# Patient Record
Sex: Male | Born: 1935 | Race: White | Hispanic: No
Health system: Southern US, Community
[De-identification: ages and names within clinical notes are randomized; demographics above are authoritative.]

---

## 2015-04-22 ENCOUNTER — Emergency Department (HOSPITAL_COMMUNITY): Payer: Medicare Other

## 2015-04-22 ENCOUNTER — Inpatient Hospital Stay (HOSPITAL_COMMUNITY)
Admission: EM | Admit: 2015-04-22 | Discharge: 2015-05-11 | DRG: 377 | Disposition: E | Payer: Medicare Other | Attending: Internal Medicine | Admitting: Internal Medicine

## 2015-04-22 ENCOUNTER — Encounter (HOSPITAL_COMMUNITY): Payer: Self-pay | Admitting: Emergency Medicine

## 2015-04-22 DIAGNOSIS — D62 Acute posthemorrhagic anemia: Secondary | ICD-10-CM | POA: Diagnosis present

## 2015-04-22 DIAGNOSIS — S065X0A Traumatic subdural hemorrhage without loss of consciousness, initial encounter: Secondary | ICD-10-CM | POA: Diagnosis present

## 2015-04-22 DIAGNOSIS — N184 Chronic kidney disease, stage 4 (severe): Secondary | ICD-10-CM | POA: Diagnosis present

## 2015-04-22 DIAGNOSIS — N179 Acute kidney failure, unspecified: Secondary | ICD-10-CM | POA: Diagnosis present

## 2015-04-22 DIAGNOSIS — K922 Gastrointestinal hemorrhage, unspecified: Secondary | ICD-10-CM | POA: Diagnosis present

## 2015-04-22 DIAGNOSIS — K921 Melena: Secondary | ICD-10-CM | POA: Diagnosis present

## 2015-04-22 DIAGNOSIS — Z515 Encounter for palliative care: Secondary | ICD-10-CM | POA: Diagnosis present

## 2015-04-22 DIAGNOSIS — Z66 Do not resuscitate: Secondary | ICD-10-CM | POA: Diagnosis present

## 2015-04-22 DIAGNOSIS — Z7982 Long term (current) use of aspirin: Secondary | ICD-10-CM | POA: Diagnosis not present

## 2015-04-22 DIAGNOSIS — N189 Chronic kidney disease, unspecified: Secondary | ICD-10-CM

## 2015-04-22 DIAGNOSIS — Z9181 History of falling: Secondary | ICD-10-CM

## 2015-04-22 DIAGNOSIS — W1830XA Fall on same level, unspecified, initial encounter: Secondary | ICD-10-CM | POA: Diagnosis present

## 2015-04-22 DIAGNOSIS — L89311 Pressure ulcer of right buttock, stage 1: Secondary | ICD-10-CM | POA: Diagnosis present

## 2015-04-22 DIAGNOSIS — Y92129 Unspecified place in nursing home as the place of occurrence of the external cause: Secondary | ICD-10-CM | POA: Diagnosis not present

## 2015-04-22 DIAGNOSIS — D649 Anemia, unspecified: Secondary | ICD-10-CM | POA: Diagnosis present

## 2015-04-22 DIAGNOSIS — R52 Pain, unspecified: Secondary | ICD-10-CM

## 2015-04-22 DIAGNOSIS — I129 Hypertensive chronic kidney disease with stage 1 through stage 4 chronic kidney disease, or unspecified chronic kidney disease: Secondary | ICD-10-CM | POA: Diagnosis present

## 2015-04-22 DIAGNOSIS — R4182 Altered mental status, unspecified: Secondary | ICD-10-CM | POA: Diagnosis present

## 2015-04-22 DIAGNOSIS — N390 Urinary tract infection, site not specified: Secondary | ICD-10-CM | POA: Diagnosis present

## 2015-04-22 DIAGNOSIS — E039 Hypothyroidism, unspecified: Secondary | ICD-10-CM | POA: Diagnosis present

## 2015-04-22 DIAGNOSIS — I10 Essential (primary) hypertension: Secondary | ICD-10-CM | POA: Diagnosis present

## 2015-04-22 DIAGNOSIS — S065X9A Traumatic subdural hemorrhage with loss of consciousness of unspecified duration, initial encounter: Secondary | ICD-10-CM

## 2015-04-22 DIAGNOSIS — L8990 Pressure ulcer of unspecified site, unspecified stage: Secondary | ICD-10-CM | POA: Diagnosis present

## 2015-04-22 DIAGNOSIS — S065XAA Traumatic subdural hemorrhage with loss of consciousness status unknown, initial encounter: Secondary | ICD-10-CM

## 2015-04-22 LAB — IRON AND TIBC
Iron: 25 ug/dL — ABNORMAL LOW (ref 45–182)
SATURATION RATIOS: 9 % — AB (ref 17.9–39.5)
TIBC: 280 ug/dL (ref 250–450)
UIBC: 255 ug/dL

## 2015-04-22 LAB — COMPREHENSIVE METABOLIC PANEL
ALBUMIN: 2.1 g/dL — AB (ref 3.5–5.0)
ALK PHOS: 156 U/L — AB (ref 38–126)
ALT: 10 U/L — AB (ref 17–63)
AST: 46 U/L — AB (ref 15–41)
Anion gap: 14 (ref 5–15)
BILIRUBIN TOTAL: 0.7 mg/dL (ref 0.3–1.2)
BUN: 47 mg/dL — AB (ref 6–20)
CO2: 15 mmol/L — ABNORMAL LOW (ref 22–32)
CREATININE: 2.82 mg/dL — AB (ref 0.61–1.24)
Calcium: 8.4 mg/dL — ABNORMAL LOW (ref 8.9–10.3)
Chloride: 111 mmol/L (ref 101–111)
GFR calc Af Amer: 21 mL/min — ABNORMAL LOW (ref >60)
GFR, EST NON AFRICAN AMERICAN: 18 mL/min — AB (ref >60)
GLUCOSE: 81 mg/dL (ref 65–99)
Potassium: 4.7 mmol/L (ref 3.5–5.1)
Sodium: 140 mmol/L (ref 135–145)
TOTAL PROTEIN: 6.2 g/dL — AB (ref 6.5–8.1)

## 2015-04-22 LAB — CBC WITH DIFFERENTIAL/PLATELET
BASOS ABS: 0 10*3/uL (ref 0.0–0.1)
Basophils Relative: 0 %
Eosinophils Absolute: 0 10*3/uL (ref 0.0–0.7)
Eosinophils Relative: 0 %
HEMATOCRIT: 16.9 % — AB (ref 39.0–52.0)
HEMOGLOBIN: 5.2 g/dL — AB (ref 13.0–17.0)
LYMPHS PCT: 4 %
Lymphs Abs: 0.6 10*3/uL — ABNORMAL LOW (ref 0.7–4.0)
MCH: 27.4 pg (ref 26.0–34.0)
MCHC: 30.8 g/dL (ref 30.0–36.0)
MCV: 88.9 fL (ref 78.0–100.0)
MONO ABS: 0.6 10*3/uL (ref 0.1–1.0)
Monocytes Relative: 5 %
NEUTROS ABS: 12.2 10*3/uL — AB (ref 1.7–7.7)
NEUTROS PCT: 91 %
Platelets: 181 10*3/uL (ref 150–400)
RBC: 1.9 MIL/uL — AB (ref 4.22–5.81)
RDW: 19 % — ABNORMAL HIGH (ref 11.5–15.5)
WBC: 13.4 10*3/uL — AB (ref 4.0–10.5)

## 2015-04-22 LAB — FOLATE: Folate: 9.3 ng/mL (ref >5.9)

## 2015-04-22 LAB — RETICULOCYTES
RBC.: 1.77 MIL/uL — AB (ref 4.22–5.81)
RETIC CT PCT: 5.6 % — AB (ref 0.4–3.1)
Retic Count, Absolute: 99.1 10*3/uL (ref 19.0–186.0)

## 2015-04-22 LAB — DIC (DISSEMINATED INTRAVASCULAR COAGULATION)PANEL
Platelets: 242 10*3/uL (ref 150–400)
Smear Review: NONE SEEN

## 2015-04-22 LAB — DIC (DISSEMINATED INTRAVASCULAR COAGULATION) PANEL
APTT: 35 s (ref 24–37)
D DIMER QUANT: 1.39 ug{FEU}/mL — AB (ref 0.00–0.50)
FIBRINOGEN: 314 mg/dL (ref 204–475)
INR: 1.79 — AB (ref 0.00–1.49)
PROTHROMBIN TIME: 20.8 s — AB (ref 11.6–15.2)

## 2015-04-22 LAB — PREPARE RBC (CROSSMATCH)

## 2015-04-22 LAB — URINALYSIS, ROUTINE W REFLEX MICROSCOPIC
BILIRUBIN URINE: NEGATIVE
Glucose, UA: NEGATIVE mg/dL
Hgb urine dipstick: NEGATIVE
KETONES UR: NEGATIVE mg/dL
NITRITE: NEGATIVE
PROTEIN: NEGATIVE mg/dL
Specific Gravity, Urine: 1.015 (ref 1.005–1.030)
pH: 5.5 (ref 5.0–8.0)

## 2015-04-22 LAB — POC OCCULT BLOOD, ED: Fecal Occult Bld: POSITIVE — AB

## 2015-04-22 LAB — URINE MICROSCOPIC-ADD ON
RBC / HPF: NONE SEEN RBC/hpf (ref 0–5)
SQUAMOUS EPITHELIAL / LPF: NONE SEEN

## 2015-04-22 LAB — TROPONIN I: TROPONIN I: 0.08 ng/mL — AB (ref <0.031)

## 2015-04-22 LAB — PROTIME-INR
INR: 1.66 — AB (ref 0.00–1.49)
Prothrombin Time: 19.6 seconds — ABNORMAL HIGH (ref 11.6–15.2)

## 2015-04-22 LAB — LACTIC ACID, PLASMA: Lactic Acid, Venous: 5.9 mmol/L (ref 0.5–2.0)

## 2015-04-22 LAB — VITAMIN B12: VITAMIN B 12: 1149 pg/mL — AB (ref 180–914)

## 2015-04-22 LAB — FERRITIN: Ferritin: 24 ng/mL (ref 24–336)

## 2015-04-22 LAB — ABO/RH: ABO/RH(D): A NEG

## 2015-04-22 MED ORDER — ONDANSETRON HCL 4 MG PO TABS: 4.0000 mg | ORAL_TABLET | Freq: Four times a day (QID) | ORAL | Status: DC | PRN

## 2015-04-22 MED ORDER — CEFTRIAXONE SODIUM 1 G IJ SOLR
1.0000 g | INTRAMUSCULAR | Status: DC
  Filled 2015-04-22: qty 10

## 2015-04-22 MED ORDER — SODIUM CHLORIDE 0.9 % IV BOLUS (SEPSIS)
1000.0000 mL | Freq: Once | INTRAVENOUS | Status: AC
  Administered 2015-04-22: 1000 mL via INTRAVENOUS

## 2015-04-22 MED ORDER — SODIUM CHLORIDE 0.9 % IV SOLN
INTRAVENOUS | Status: DC
  Administered 2015-04-22: 23:00:00 via INTRAVENOUS

## 2015-04-22 MED ORDER — ONDANSETRON HCL 4 MG/2ML IJ SOLN
4.0000 mg | Freq: Once | INTRAMUSCULAR | Status: AC
  Administered 2015-04-22: 4 mg via INTRAVENOUS
  Filled 2015-04-22: qty 2

## 2015-04-22 MED ORDER — DEXTROSE 5 % IV SOLN
1.0000 g | Freq: Once | INTRAVENOUS | Status: AC
  Administered 2015-04-22: 1 g via INTRAVENOUS
  Filled 2015-04-22: qty 10

## 2015-04-22 MED ORDER — SODIUM CHLORIDE 0.9 % IV SOLN
Freq: Once | INTRAVENOUS | Status: AC
  Administered 2015-04-22: 22:00:00 via INTRAVENOUS

## 2015-04-22 MED ORDER — VITAMIN K1 10 MG/ML IJ SOLN
5.0000 mg | Freq: Once | INTRAVENOUS | Status: AC
  Administered 2015-04-22: 5 mg via INTRAVENOUS
  Filled 2015-04-22: qty 0.5

## 2015-04-22 MED ORDER — ONDANSETRON HCL 4 MG/2ML IJ SOLN: 4.0000 mg | Freq: Four times a day (QID) | INTRAMUSCULAR | Status: DC | PRN

## 2015-04-22 MED ORDER — PANTOPRAZOLE SODIUM 40 MG IV SOLR
40.0000 mg | Freq: Two times a day (BID) | INTRAVENOUS | Status: DC
  Administered 2015-04-22: 40 mg via INTRAVENOUS
  Filled 2015-04-22: qty 40

## 2015-04-22 MED ORDER — FENTANYL CITRATE (PF) 100 MCG/2ML IJ SOLN
50.0000 ug | Freq: Once | INTRAMUSCULAR | Status: AC
Start: 2015-04-22 — End: 2015-04-22
  Administered 2015-04-22: 50 ug via INTRAVENOUS
  Filled 2015-04-22: qty 2

## 2015-04-22 NOTE — ED Notes (Signed)
MD made aware of patient family request for an update.  Family made aware of wait time, and will return in approx 20 minutes

## 2015-04-22 NOTE — ED Notes (Signed)
Pt was 91% on RA.  Pt placed on 2L [place]

## 2015-04-22 NOTE — ED Notes (Signed)
Critical hemoglobin of 5.5 called from lab.  Communicated to Dr. Clayborne DanaMesner.

## 2015-04-22 NOTE — ED Provider Notes (Addendum)
CSN: [id number]     Arrival date & time 05/05/2015  1603 History   First MD Initiated Contact with Patient 2015-05-05 1604     Chief Complaint  Patient presents with  . Altered Mental Status     (Consider location/radiation/quality/duration/timing/severity/associated sxs/prior Treatment) Patient is a 82 y.o. male presenting with altered mental status.  Altered Mental Status Presenting symptoms: behavior changes   Severity:  Mild Most recent episode:  More than 2 days ago Episode history:  Single Timing:  Constant Progression:  Worsening Chronicity:  New Context: nursing home resident   Context: not alcohol use, not dementia and not head injury   Associated symptoms: weakness   Associated symptoms: no abdominal pain, no fever, no nausea and no seizures     No past medical history on file. No past surgical history on file. No family history on file. Social History  Substance Use Topics  . Smoking status: Not on file  . Smokeless tobacco: Not on file  . Alcohol Use: Not on file    Review of Systems  Constitutional: Positive for appetite change and fatigue. Negative for fever.  Eyes: Negative for pain.  Gastrointestinal: Positive for diarrhea. Negative for nausea and abdominal pain.  Genitourinary: Positive for decreased urine volume.  Neurological: Positive for weakness. Negative for dizziness, seizures and numbness.  All other systems reviewed and are negative.     Allergies  Review of patient's allergies indicates not on file.  Home Medications   Prior to Admission medications   Not on File   There were no vitals taken for this visit. Physical Exam  Constitutional: He appears well-developed and well-nourished.  HENT:  Head: Normocephalic and atraumatic.  Neck: Normal range of motion.  Cardiovascular: Normal rate.   Pulmonary/Chest: Effort normal. No respiratory distress. He has no wheezes. He has no rales.  Intermittent rhonchi, patient not taking deep breaths   Abdominal: Soft. He exhibits no distension. There is no tenderness.  Musculoskeletal: Normal range of motion.  Neurological: He is alert. No cranial nerve deficit. He exhibits normal muscle tone. Coordination normal.  Skin: Skin is warm and dry.  Multiple ecchymosis of different stages of healing on bilateral upper extremities  Nursing note and vitals reviewed.   ED Course  Procedures (including critical care time)  CRITICAL CARE Performed by: Marily Memos   Total critical care time: 45 minutes Critical care time was exclusive of separately billable procedures and treating other patients. Critical care was necessary to treat or prevent imminent or life-threatening deterioration. Critical care was time spent personally by me on the following activities: development of treatment plan with patient and/or surrogate as well as nursing, discussions with consultants, evaluation of patient's response to treatment, examination of patient, obtaining history from patient or surrogate, ordering and performing treatments and interventions, ordering and review of laboratory studies, ordering and review of radiographic studies, pulse oximetry and re-evaluation of patient's condition.   Labs Review Labs Reviewed  CBC WITH DIFFERENTIAL/PLATELET - Abnormal; Notable for the following:    WBC 13.4 (*)    RBC 1.90 (*)    Hemoglobin 5.2 (*)    HCT 16.9 (*)    RDW 19.0 (*)    Neutro Abs 12.2 (*)    Lymphs Abs 0.6 (*)    All other components within normal limits  COMPREHENSIVE METABOLIC PANEL - Abnormal; Notable for the following:    CO2 15 (*)    BUN 47 (*)    Creatinine, Ser 2.82 (*)  Calcium 8.4 (*)    Total Protein 6.2 (*)    Albumin 2.1 (*)    AST 46 (*)    ALT 10 (*)    Alkaline Phosphatase 156 (*)    GFR calc non Af Amer 18 (*)    GFR calc Af Amer 21 (*)    All other components within normal limits  TROPONIN I - Abnormal; Notable for the following:    Troponin I 0.08 (*)    All  other components within normal limits  URINALYSIS, ROUTINE W REFLEX MICROSCOPIC (NOT AT [facility]) - Abnormal; Notable for the following:    APPearance HAZY (*)    Leukocytes, UA SMALL (*)    All other components within normal limits  PROTIME-INR - Abnormal; Notable for the following:    Prothrombin Time 19.6 (*)    INR 1.66 (*)    All other components within normal limits  URINE MICROSCOPIC-ADD ON - Abnormal; Notable for the following:    Bacteria, UA MANY (*)    All other components within normal limits  POC OCCULT BLOOD, ED - Abnormal; Notable for the following:    Fecal Occult Bld POSITIVE (*)    All other components within normal limits  GASTROINTESTINAL PANEL BY PCR, STOOL (REPLACES STOOL CULTURE)  LACTIC ACID, PLASMA  DIC (DISSEMINATED INTRAVASCULAR COAGULATION) PANEL  VITAMIN B12  FOLATE  IRON AND TIBC  FERRITIN  RETICULOCYTES  TYPE AND SCREEN  PREPARE RBC (CROSSMATCH)  ABO/RH    Imaging Review Dg Chest 2 View  08-Oct-2015  CLINICAL DATA:  Altered mental status, confusion EXAM: CHEST  2 VIEW COMPARISON:  None. FINDINGS: Status post median sternotomy. Cardiomediastinal silhouette is unremarkable. Hyperinflation is noted. There is small bilateral pleural effusion. Streaky bilateral basilar atelectasis or infiltrate. No pulmonary edema. IMPRESSION: Hyperinflation. No pulmonary edema. Status post median sternotomy. Small bilateral pleural effusion with streaky bilateral basilar atelectasis or infiltrate. Electronically Signed   By: Natasha MeadLiviu  Pop M.D.   On: 001-Jul-2017 16:32   Dg Pelvis 1-2 Views  08-Oct-2015  CLINICAL DATA:  Acute onset of altered mental status. Decreased urinary output. Initial encounter. EXAM: PELVIS - 1-2 VIEW COMPARISON:  None. FINDINGS: There is no evidence of fracture or dislocation. Both femoral heads are seated normally within their respective acetabula. Degenerative change is noted at the lower lumbar spine. The sacroiliac joints are grossly unremarkable. The  visualized bowel gas pattern is grossly unremarkable in appearance. Diffuse vascular calcifications are seen. IMPRESSION: 1. No evidence of fracture or dislocation. 2. Diffuse vascular calcifications seen. Electronically Signed   By: Roanna RaiderJeffery  Chang M.D.   On: 001-Jul-2017 19:17   Ct Head Wo Contrast  08-Oct-2015  ADDENDUM REPORT: 001-Jul-2017 17:14 ADDENDUM: Study discussed by telephone with Dr. Marily MemosJASON Tranisha Tissue on 08-Oct-2015 at 17:13 . Electronically Signed   By: Odessa FlemingH  Hall M.D.   On: 001-Jul-2017 17:14  08-Oct-2015  CLINICAL DATA:  82 year old male with progressive altered mental status for 1 week. Initial encounter. EXAM: CT HEAD WITHOUT CONTRAST TECHNIQUE: Contiguous axial images were obtained from the base of the skull through the vertex without intravenous contrast. COMPARISON:  None. FINDINGS: Study is intermittently degraded by motion artifact despite repeated imaging attempts. Trace fluid level in the left maxillary sinus. Mild mucosal thickening left sphenoid sinus. Other Visualized paranasal sinuses and mastoids are clear. No acute osseous abnormality identified. Calcified atherosclerosis at the skull base. Calcified scalp vasculature. Negative orbits soft tissues; postoperative changes to both globes. Bilateral low-density subdural collections, greater on the left. That on  the left measures up to 7 mm in thickness while on the right is 5-6 mm thickness. Trace rightward midline shift. No hyperdense intracranial hemorrhage identified. Basilar cisterns remain patent. No ventriculomegaly. Patchy, mild to moderate for age cerebral white matter and left thalamic hypodensity. No cortically based acute infarct identified. IMPRESSION: 1. Small bilateral hypodense subdural hematomas, 7 mm on the left and 5-6 mm on the right. Trace rightward midline shift. 2. No other acute intracranial abnormality. 3. Small fluid level left maxillary sinus, likely inflammatory. Electronically Signed: By: [place] Fleming M.D. On: 05-05-15 17:06    Dg Femur Min 2 Views Left  05/05/15  CLINICAL DATA:  Altered mental status.  Twitching LEFT leg. EXAM: LEFT FEMUR 2 VIEWS COMPARISON:  None. FINDINGS: No fracture dislocation of LEFT femurs. Extensive arteriovascular calcification noted. IMPRESSION: No acute osseous abnormality. Electronically Signed   By: Genevive Bi M.D.   On: May 05, 2015 19:15   I have personally reviewed and evaluated these images and lab results as part of my medical decision-making.   EKG Interpretation   Date/Time:  Friday May 05, 2015 17:05:09 EST Ventricular Rate:  72 PR Interval:  181 QRS Duration: 147 QT Interval:  456 QTC Calculation: 499 R Axis:   16 Text Interpretation:  Unknown rhythm, irregular rate Right bundle branch  block No previous to compare to Confirmed by [facility] MD, Barbara Cower [phone]) on  May 05, 2015 5:24:22 PM      MDM   Final diagnoses:  Acute on chronic kidney failure (HCC)  UTI (lower urinary tract infection)  Subdural hematoma (HCC)  Anemia, unspecified anemia type  Gastrointestinal hemorrhage with melena   82 year old male without a lot of medical history presents to the emergency department today with disorientation, weakness, decreased by mouth intake, decreased urine output and diarrhea. All these symptoms of a fall over the last 4-5 days since he moved to a new nursing home. Apparently has also fallen multiple times at the old facility and has multiple bruises. No known head injuries. Differential includes urinary tract infection, acute kidney injury, head bleed or infection. We'll evaluate appropriately and start with a fluid bolus.  5:59 PM D/W Dr. Jeral Fruit, NSG about possible intraventions and doesn't feel there is any surgical intervention at this time.   7:24 PM D/W [facility] regarding anemia and hemoccult positive stool. They will see the patient.   D/W medicine for multiple medical problems, will admit to stepdown. Lab data the family rule out the plan of care they  had reservations about any "heroic measures". And reiterated they wanted the patient to be DO NOT RESUSCITATE. Also would likely not want to undergo any procedures such as endoscopy. I relayed this information to the hospitalist who will handle the case accordingly.    Marily Memos, MD 04/13/2015 1191  Marily Memos, MD 04/15/2015 4782

## 2015-04-22 NOTE — ED Notes (Signed)
Multiple warm blankets applied.

## 2015-04-22 NOTE — ED Notes (Signed)
Patient transported to X-ray 

## 2015-04-22 NOTE — ED Notes (Signed)
Notified phlebotomy of need for assistance with blood

## 2015-04-22 NOTE — ED Notes (Signed)
MD at bedside updating family at this time  

## 2015-04-22 NOTE — ED Notes (Addendum)
To ED via EMS with AMS that progressively worsened over 1 week. Patient alert, Follows commands. Patient with C/O decreased urine output.  Has a history of UTI's. Patient having diarrhea that began this AM.  VS - 125/43; P -74 and irregular; T - 99.1;  CBG - 150.  SaO2 - 92 on room air, 100 % on 2 l/m O2.

## 2015-04-22 NOTE — H&P (Addendum)
[facility] History and Physical  Matthew BerlinBascom Harari ZOX:[id number] DOB: 04-07-1921 DOA: 27-Nov-2015  Referring physician: Harold HedgeJason Messner, M.D. PCP: No PCP Per Patient   Chief Complaint: Altered mental status.  HPI: Matthew Macias is a 82 y.o. male with a past medical history of hypertension, hypothyroidism, peripheral vascular disease who was brought to the emergency department via EMS with progressively worse altered mental status during the past week. Per family member, patient has had decrease in urine output and has a history of UTIs. Also had an episode of loose stools in the morning.  In the ER, workup revealed severe anemia, positive stool guaiac and mild pyuria with bacteriuria.   Review of Systems:  Unable to review.  History reviewed. No pertinent past medical history. No past surgical history on file. Social History:  has no tobacco, alcohol, and drug history on file.  No Known Allergies  History reviewed. No pertinent family history.    Prior to Admission medications   Medication Sig Start Date End Date Taking? Authorizing Provider  acetaminophen (TYLENOL) 500 MG tablet Take 500 mg by mouth every 6 (six) hours as needed.   Yes Historical Provider, MD  amLODipine (NORVASC) 5 MG tablet Take 5 mg by mouth daily.   Yes Historical Provider, MD  aspirin 81 MG tablet Take 81 mg by mouth daily.   Yes Historical Provider, MD  cilostazol (PLETAL) 50 MG tablet Take 50 mg by mouth 2 (two) times daily.   Yes Historical Provider, MD  levothyroxine (SYNTHROID, LEVOTHROID) 100 MCG tablet Take 100 mcg by mouth daily before breakfast.   Yes Historical Provider, MD  metoprolol tartrate (LOPRESSOR) 25 MG tablet Take 25 mg by mouth 2 (two) times daily.   Yes Historical Provider, MD  omeprazole (PRILOSEC) 20 MG capsule Take 20 mg by mouth daily.   Yes Historical Provider, MD  sertraline (ZOLOFT) 25 MG tablet Take 25 mg by mouth daily.   Yes Historical Provider, MD   Physical Exam: Filed  Vitals:   2016/02/18 2102 2016/02/18 2142 2016/02/18 2155 2016/02/18 2159  BP: 146/46   130/54  Pulse: 92  85 87  Temp:  96.6 F (35.9 C)    TempSrc:  Rectal    Resp: 22  23 22   SpO2: 96%  98% 96%    Wt Readings from Last 3 Encounters:  No data found for Wt    General:  Confused, but in no acute distress. Eyes: PERRL, normal lids, irises & conjunctiva ENT: grossly normal hearing, lips and oral mucosa are dry. Neck: no LAD, masses or thyromegaly Cardiovascular: Irregularly irregular, no m/r/g. No LE edema. Telemetry: SR, multiple extrasystoles. Respiratory: Decreased breath sounds on bases. Bilateral rhonchi. Abdomen: soft, ntnd Skin: Multiple ecchymoses on upper extremities. Musculoskeletal: grossly normal tone BUE/BLE Psychiatric: Confused Neurologic: Confused. Moves all extremities. Unable to fully evaluate.           Labs on Admission:  Basic Metabolic Panel:  Recent Labs Lab 2016/02/18 1705  NA 140  K 4.7  CL 111  CO2 15*  GLUCOSE 81  BUN 47*  CREATININE 2.82*  CALCIUM 8.4*   Liver Function Tests:  Recent Labs Lab 2016/02/18 1705  AST 46*  ALT 10*  ALKPHOS 156*  BILITOT 0.7  PROT 6.2*  ALBUMIN 2.1*   CBC:  Recent Labs Lab 2016/02/18 1705 2016/02/18 2002  WBC 13.4*  --   NEUTROABS 12.2*  --   HGB 5.2*  --   HCT 16.9*  --   MCV 88.9  --  PLT 181 242   Cardiac Enzymes:  Recent Labs Lab 05/04/15 1705  TROPONINI 0.08*    Urine microscopic-add on [[id number]] (Abnormal) Collected: 2015/05/04 1825    Updated: 2015/05/04 1912     Squamous Epithelial / LPF NONE SEEN    WBC, UA 6-30 WBC/hpf    RBC / HPF NONE SEEN RBC/hpf    Bacteria, UA MANY (A)   Urinalysis, Routine w reflex microscopic (not at [facility]) [[id number]] (Abnormal) Collected: May 04, 2015 1825   Updated: 2015/05/04 1912    Specimen Type: Urine    Specimen Source: Urine, Catheterized     Color, Urine YELLOW    APPearance HAZY (A)    Specific Gravity, Urine 1.015    pH 5.5    Glucose,  UA NEGATIVE mg/dL    Hgb urine dipstick NEGATIVE    Bilirubin Urine NEGATIVE    Ketones, ur NEGATIVE mg/dL    Protein, ur NEGATIVE mg/dL    Nitrite NEGATIVE    Leukocytes, UA SMALL (A)   POC occult blood, ED [[id number]] (Abnormal) Collected: May 04, 2015 1859   Updated: 2015-05-04 1910    Specimen Type: Stool     Fecal Occult Bld POSITIVE (A)     Radiological Exams on Admission: Dg Chest 2 View  05-04-15  CLINICAL DATA:  Altered mental status, confusion EXAM: CHEST  2 VIEW COMPARISON:  None. FINDINGS: Status post median sternotomy. Cardiomediastinal silhouette is unremarkable. Hyperinflation is noted. There is small bilateral pleural effusion. Streaky bilateral basilar atelectasis or infiltrate. No pulmonary edema. IMPRESSION: Hyperinflation. No pulmonary edema. Status post median sternotomy. Small bilateral pleural effusion with streaky bilateral basilar atelectasis or infiltrate. Electronically Signed   By: Natasha Mead M.D.   On: 05/04/2015 16:32   Dg Pelvis 1-2 Views  05-04-2015  CLINICAL DATA:  Acute onset of altered mental status. Decreased urinary output. Initial encounter. EXAM: PELVIS - 1-2 VIEW COMPARISON:  None. FINDINGS: There is no evidence of fracture or dislocation. Both femoral heads are seated normally within their respective acetabula. Degenerative change is noted at the lower lumbar spine. The sacroiliac joints are grossly unremarkable. The visualized bowel gas pattern is grossly unremarkable in appearance. Diffuse vascular calcifications are seen. IMPRESSION: 1. No evidence of fracture or dislocation. 2. Diffuse vascular calcifications seen. Electronically Signed   By: Roanna Raider M.D.   On: 2015/05/04 19:17   Ct Head Wo Contrast  05/04/2015  ADDENDUM REPORT: 05-04-15 17:14 ADDENDUM: Study discussed by telephone with Dr. Marily Memos on May 04, 2015 at 17:13 . Electronically Signed   By: Odessa Fleming M.D.   On: 04-May-2015 17:14  2015-05-04  CLINICAL DATA:  82 year old male  with progressive altered mental status for 1 week. Initial encounter. EXAM: CT HEAD WITHOUT CONTRAST TECHNIQUE: Contiguous axial images were obtained from the base of the skull through the vertex without intravenous contrast. COMPARISON:  None. FINDINGS: Study is intermittently degraded by motion artifact despite repeated imaging attempts. Trace fluid level in the left maxillary sinus. Mild mucosal thickening left sphenoid sinus. Other Visualized paranasal sinuses and mastoids are clear. No acute osseous abnormality identified. Calcified atherosclerosis at the skull base. Calcified scalp vasculature. Negative orbits soft tissues; postoperative changes to both globes. Bilateral low-density subdural collections, greater on the left. That on the left measures up to 7 mm in thickness while on the right is 5-6 mm thickness. Trace rightward midline shift. No hyperdense intracranial hemorrhage identified. Basilar cisterns remain patent. No ventriculomegaly. Patchy, mild to moderate for age cerebral white matter and left thalamic hypodensity. No cortically  based acute infarct identified. IMPRESSION: 1. Small bilateral hypodense subdural hematomas, 7 mm on the left and 5-6 mm on the right. Trace rightward midline shift. 2. No other acute intracranial abnormality. 3. Small fluid level left maxillary sinus, likely inflammatory. Electronically Signed: By: Odessa Fleming M.D. On: 04/18/2015 17:06   Dg Femur Min 2 Views Left  04/11/2015  CLINICAL DATA:  Altered mental status.  Twitching LEFT leg. EXAM: LEFT FEMUR 2 VIEWS COMPARISON:  None. FINDINGS: No fracture dislocation of LEFT femurs. Extensive arteriovascular calcification noted. IMPRESSION: No acute osseous abnormality. Electronically Signed   By: Genevive Bi M.D.   On: 04/11/2015 19:15    EKG: Independently reviewed.  Vent. rate 72 BPM PR interval 181 ms QRS duration 147 ms QT/QTc 456/499 ms P-R-T axes -80 16 59 Unknown rhythm, irregular rate Right bundle branch  block No previous to compare to   Assessment/Plan Principal Problem:   UGI bleed Admit to a stepdown. Continue transfusion of packed RBCs. Hold Pletal. GI has been consulted for evaluation, however his daughter states that they would not like invasive procedures or heroic measures. Patient is DO NOT RESUSCITATE/DO NOT INTUBATE.  Active Problems:   Acute blood loss anemia Continue packed RBCs transfusion. Monitor hematocrit and hemoglobin periodically.     Pressure ulcer stage I Continue local wound care.    Chronic kidney disease (CKD), stage IV (severe) (HCC) Per daughter, he has a history of chronic renal disease, however we do not know  at this time, what is his baseline creatinine level.    HTN (hypertension) Continue amlodipine 5 mg by mouth daily. Monitor blood pressure periodically.    Hypothyroidism Continue levothyroxine 100 g by mouth daily. Monitor TSH.      UTI (urinary tract infection) Continue IV Rocephin. Follow-up urine culture and sensitivity.    Small bilateral hypodense subacute subdural hematomas on CT scan. Patient has had multiple falls at his nursing facility. The case was discussed with neurosurgery by the emergency department and Dr. Jeral Fruit doesn't feel like there is any surgical intervention to be done at this time.   GI was consulted.   Code Status: DO NOT RESUSCITATE,/DO NOT INTUBATE DVT Prophylaxis: SCDs Family Communication:  Denny,Joanne Daughter [phone]  She was present in the ER during evaluation.  Disposition Plan: Admit to a stepdown for further management.   Time spent: Over 70 minutes were spent in the process of this admission.  Bobette Mo [facility] Pager [phone].

## 2015-04-22 NOTE — ED Notes (Signed)
Admitting MD and Dr. Clayborne DanaMesner made aware of Lactic Acid

## 2015-04-22 NOTE — ED Notes (Addendum)
Family member states pt c/o cramps and twitching to the left leg, but denies when asked, however patient moaning

## 2015-04-22 NOTE — ED Notes (Signed)
Phlebotomy at bedside.

## 2015-04-22 NOTE — ED Notes (Signed)
Pt is fidgeting in bed.  Patient seems very uncomfortable.  Pt continues to moan regularly.  Pt states "I just want to die" over and over again.  This RN attempted to reassure.  Patient attempted to rest at this time.

## 2015-04-23 DIAGNOSIS — K922 Gastrointestinal hemorrhage, unspecified: Secondary | ICD-10-CM

## 2015-04-23 LAB — DIFFERENTIAL
BASOS ABS: 0 10*3/uL (ref 0.0–0.1)
BASOS PCT: 0 %
Eosinophils Absolute: 0 10*3/uL (ref 0.0–0.7)
Eosinophils Relative: 0 %
LYMPHS PCT: 3 %
Lymphs Abs: 0.5 10*3/uL — ABNORMAL LOW (ref 0.7–4.0)
MONO ABS: 0.9 10*3/uL (ref 0.1–1.0)
Monocytes Relative: 5 %
NEUTROS PCT: 92 %
Neutro Abs: 16.5 10*3/uL — ABNORMAL HIGH (ref 1.7–7.7)

## 2015-04-23 LAB — TYPE AND SCREEN
ABO/RH(D): A NEG
ANTIBODY SCREEN: NEGATIVE
UNIT DIVISION: 0
Unit division: 0

## 2015-04-23 LAB — GASTROINTESTINAL PANEL BY PCR, STOOL (REPLACES STOOL CULTURE)
ASTROVIRUS: NOT DETECTED
Adenovirus F40/41: NOT DETECTED
CAMPYLOBACTER SPECIES: NOT DETECTED
Cryptosporidium: NOT DETECTED
Cyclospora cayetanensis: NOT DETECTED
E. COLI O157: NOT DETECTED
ENTEROAGGREGATIVE E COLI (EAEC): NOT DETECTED
ENTEROTOXIGENIC E COLI (ETEC): NOT DETECTED
Entamoeba histolytica: NOT DETECTED
Enteropathogenic E coli (EPEC): NOT DETECTED
GIARDIA LAMBLIA: NOT DETECTED
NOROVIRUS GI/GII: NOT DETECTED
PLESIMONAS SHIGELLOIDES: NOT DETECTED
Rotavirus A: NOT DETECTED
SALMONELLA SPECIES: NOT DETECTED
SAPOVIRUS (I, II, IV, AND V): NOT DETECTED
SHIGELLA/ENTEROINVASIVE E COLI (EIEC): NOT DETECTED
Shiga like toxin producing E coli (STEC): NOT DETECTED
Vibrio cholerae: NOT DETECTED
Vibrio species: NOT DETECTED
Yersinia enterocolitica: NOT DETECTED

## 2015-04-23 LAB — MRSA PCR SCREENING: MRSA by PCR: NEGATIVE

## 2015-04-23 LAB — COMPREHENSIVE METABOLIC PANEL
ALT: 56 U/L (ref 17–63)
ANION GAP: 17 — AB (ref 5–15)
AST: 306 U/L — AB (ref 15–41)
Albumin: 1.7 g/dL — ABNORMAL LOW (ref 3.5–5.0)
Alkaline Phosphatase: 160 U/L — ABNORMAL HIGH (ref 38–126)
BILIRUBIN TOTAL: 0.9 mg/dL (ref 0.3–1.2)
BUN: 52 mg/dL — AB (ref 6–20)
CHLORIDE: 118 mmol/L — AB (ref 101–111)
CO2: 9 mmol/L — ABNORMAL LOW (ref 22–32)
Calcium: 7.9 mg/dL — ABNORMAL LOW (ref 8.9–10.3)
Creatinine, Ser: 3.15 mg/dL — ABNORMAL HIGH (ref 0.61–1.24)
GFR calc Af Amer: 18 mL/min — ABNORMAL LOW (ref >60)
GFR, EST NON AFRICAN AMERICAN: 16 mL/min — AB (ref >60)
Glucose, Bld: 34 mg/dL — CL (ref 65–99)
POTASSIUM: 6.1 mmol/L — AB (ref 3.5–5.1)
Sodium: 144 mmol/L (ref 135–145)
TOTAL PROTEIN: 5.6 g/dL — AB (ref 6.5–8.1)

## 2015-04-23 LAB — PHOSPHORUS: PHOSPHORUS: 8.3 mg/dL — AB (ref 2.5–4.6)

## 2015-04-23 LAB — HEMATOCRIT: HCT: 20.5 % — ABNORMAL LOW (ref 39.0–52.0)

## 2015-04-23 LAB — TROPONIN I: TROPONIN I: 0.19 ng/mL — AB (ref <0.031)

## 2015-04-23 LAB — MAGNESIUM: MAGNESIUM: 2.1 mg/dL (ref 1.7–2.4)

## 2015-04-23 LAB — HEMOGLOBIN: Hemoglobin: 6.5 g/dL — CL (ref 13.0–17.0)

## 2015-04-23 MED ORDER — FENTANYL CITRATE (PF) 100 MCG/2ML IJ SOLN
50.0000 ug | Freq: Once | INTRAMUSCULAR | Status: AC
  Administered 2015-04-23: 50 ug via INTRAVENOUS
  Filled 2015-04-23: qty 2

## 2015-04-23 MED ORDER — MORPHINE SULFATE (PF) 2 MG/ML IV SOLN: 2.0000 mg | INTRAVENOUS | Status: DC

## 2015-04-23 MED ORDER — MORPHINE SULFATE (PF) 2 MG/ML IV SOLN
INTRAVENOUS | Status: AC
  Filled 2015-04-23: qty 1

## 2015-04-23 MED ORDER — MORPHINE SULFATE (PF) 2 MG/ML IV SOLN
1.0000 mg | INTRAVENOUS | Status: DC | PRN
Start: 2015-04-23 — End: 2015-04-23

## 2015-04-27 MED FILL — Medication: Qty: 1 | Status: AC

## 2015-05-11 NOTE — Progress Notes (Signed)
  Patient Name: Matthew Macias   MRN: [id number]   Date of Birth/ Sex: January 06, 1921 , male      Admission Date: 05/01/2015  Attending Provider: Edsel PetrinMaryann Mikhail, DO  Primary Diagnosis: Subdural hematoma (HCC) [I62.00] Pain [R52] UTI (lower urinary tract infection) [N39.0] Acute on chronic kidney failure (HCC) [N17.9, N18.9] Gastrointestinal hemorrhage with melena [K92.1] Anemia, unspecified anemia type [D64.9]   Indication: Pt was in his usual state of health until this AM, when he was noted to be unresponsive. Code blue was subsequently called. At the time of arrival on scene, ACLS protocol was underway.   Technical Description:  - CPR performance duration:  10  minutes  - Was defibrillation or cardioversion used? No   - Was external pacer placed? No  - Was patient intubated pre/post CPR? No   Medications Administered: Y = Yes; Blank = No Amiodarone    Atropine    Calcium  y  Epinephrine  y  Lidocaine    Magnesium    Norepinephrine    Phenylephrine    Sodium bicarbonate    Vasopressin    Other y   Post CPR evaluation:  - Final Status - Was patient successfully resuscitated ? No   Miscellaneous Information:  - Time of death:  7:22  AM  - Primary team notified?  Yes  - Family Notified? Yes   Resuscitation efforts were discontinued after being notified by the primary team that she had spoken with patients daughter and code status was DNR.  This DNR status was reflected in the chart on admission.    Gwynn BurlyAndrew Pietrina Jagodzinski, DO   2015-08-22, 7:41 AM

## 2015-05-11 NOTE — Progress Notes (Signed)
Patient expired at [id number].  Absence of breath sounds and heart beat verified by Marisue Ivanobyn Myrick, RN and Silvio PateBarbara Grogan, RN.  Dr. Catha GosselinMikhail notified. Patient's daughter notified.  [facility] notified.

## 2015-05-11 NOTE — Discharge Summary (Signed)
Death Summary  Matthew Macias UJW:[id number] DOB: May 04, 1920 DOA: 09-23-2015  PCP: No PCP Per Patient PCP/Office notified:   Admit date: 09-23-2015 Date of Death: 04/22/2015  Final Diagnoses:  Principal Problem:   UGI bleed Active Problems:   Anemia   Pressure ulcer   Chronic kidney disease (CKD), stage IV (severe) (HCC)   HTN (hypertension)   Hypothyroidism   Acute blood loss anemia   UTI (urinary tract infection)  History of present illness:  On 09-23-2015 by Dr. Sanda Kleinavid Ortiz Matthew Macias is a 82 y.o. male with a past medical history of hypertension, hypothyroidism, peripheral vascular disease who was brought to the emergency department via EMS with progressively worse altered mental status during the past week. Per family member, patient has had decrease in urine output and has a history of UTIs. Also had an episode of loose stools in the morning.  In the ER, workup revealed severe anemia, positive stool guaiac and mild pyuria with bacteriuria.  Hospital Course:  This is a 82 year old male who was brought to the hospital for altered mental status and found to have and upper GI bleed. Upon admission he was noted to have a hemoglobin of  5.2. He was transfused.  Unfortunately, patient began to decline. In the admitting note, patient was noted to be DO NOT RESUSCITATE. A CODE BLUE was called, compressions were started. I called and spoke with patient's daughter confirmed DO NOT RESUSCITATE CODE STATUS. Resuscitation efforts were discontinued.  After limited resuscitation, patient did have crackles as well as agonal breathing. Patient was transitioned to comfort care and placed on morphine. Patient expired at [id number]. Patient's daughter made aware.  Death certificate at bedside.  Time: 35 minutes  Signed:  Edsel PetrinMIKHAIL, Matthew Macias  [facility] 05/04/2015, 2:37 PM

## 2015-05-11 NOTE — Progress Notes (Signed)
Contacted on call [facility] to verify code status.  When patient arrived to floor he was listed as DNR.  He since was changed to a Full code.  MD was unsure of code status since she was not the one to change the order and said she would pass it on to the morning MD.

## 2015-05-11 NOTE — Progress Notes (Signed)
UR COMPLETED  

## 2015-05-11 NOTE — Progress Notes (Signed)
CRITICAL VALUE ALERT  Critical value received:  K 6.1, Glucose 34  Date of notification:  04/29/2015  Time of notification:  0710  Critical value read back:Yes.    Nurse who received alert:  Balinda QuailsJessica Miller, RN   MD notified (1st page):  Mikhail  Time of first page:  [phone]

## 2015-05-11 DEATH — deceased

## 2016-07-29 IMAGING — DX DG FEMUR 2+V*L*
5 series · 5 of 5 positions shown · non-contrast
Comparison: None.

CLINICAL DATA: Altered mental status.  Twitching LEFT leg.

EXAM:
LEFT FEMUR 2 VIEWS

[femur ap (1 of 2)]
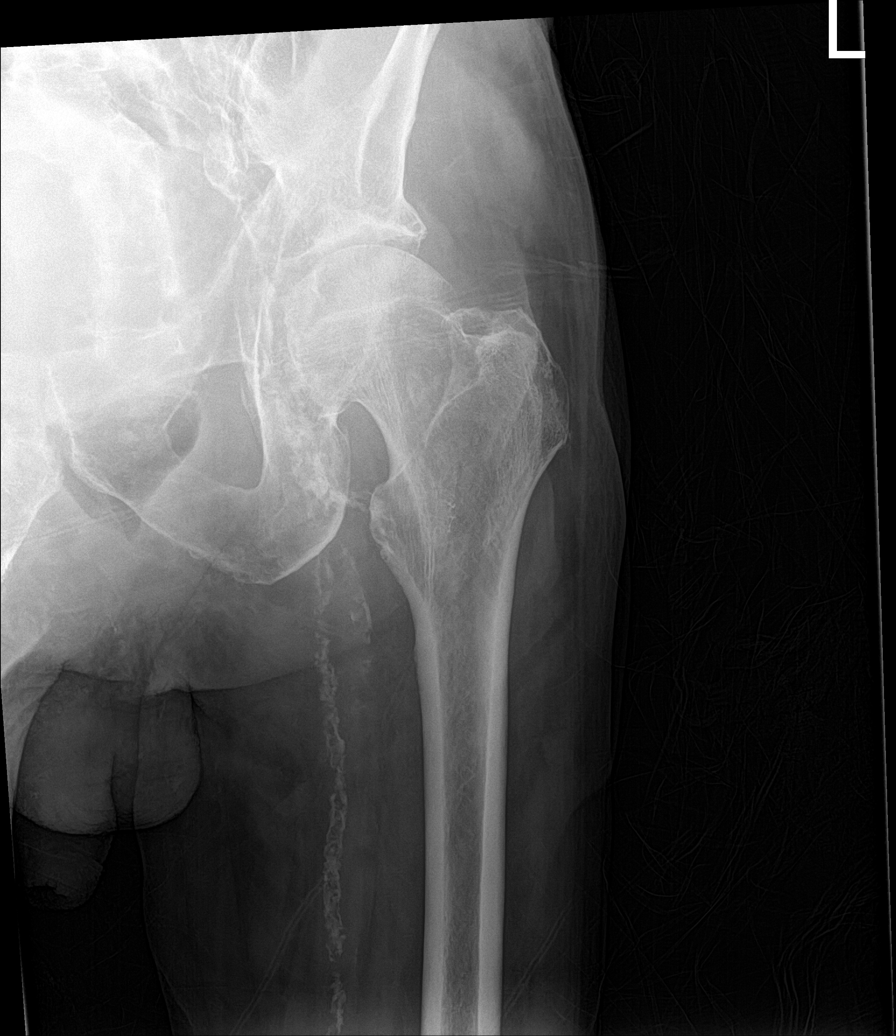

[femur ap (2 of 2)]
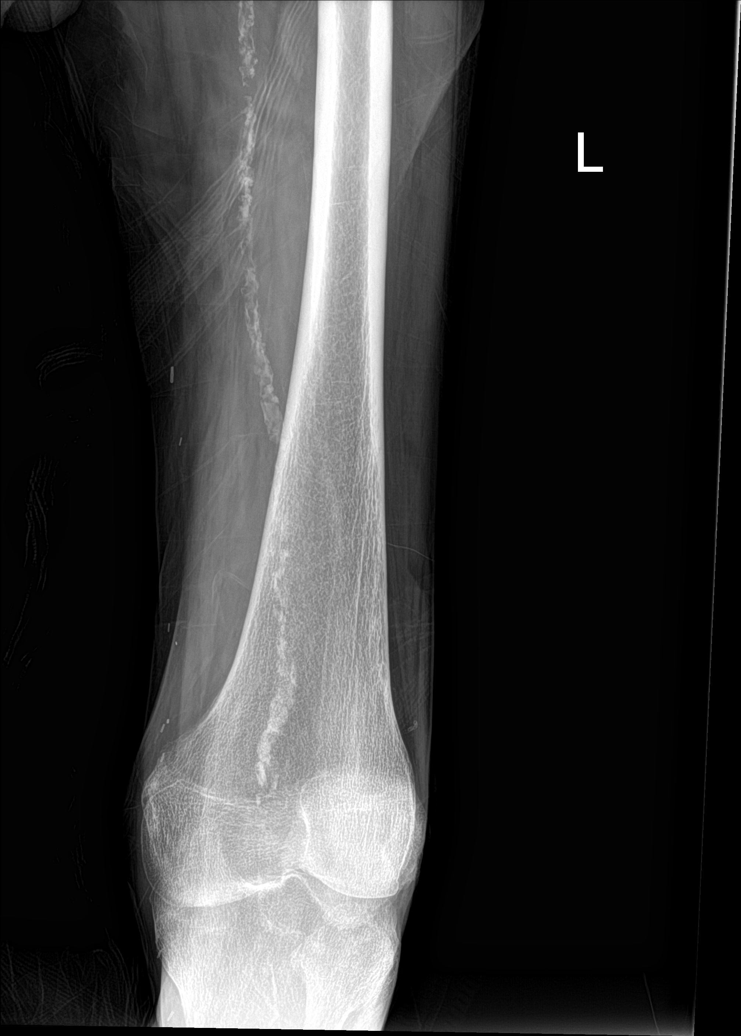

[femur lat (1 of 3)]
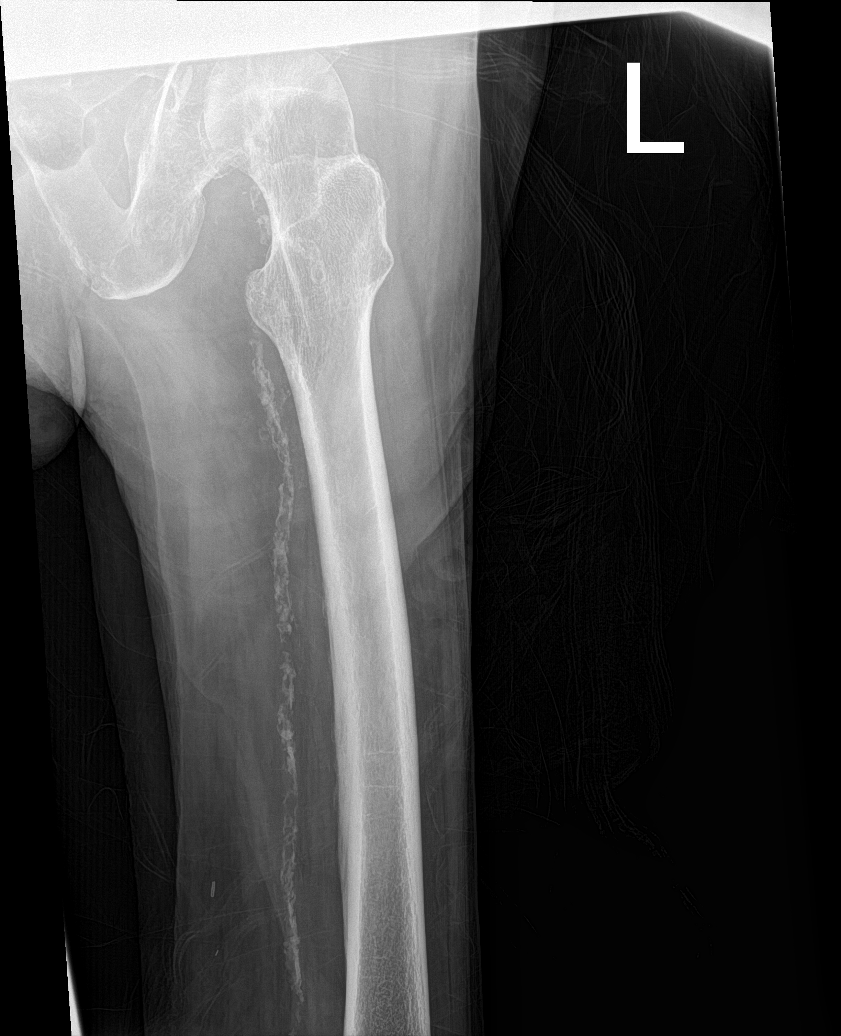

[femur lat (2 of 3)]
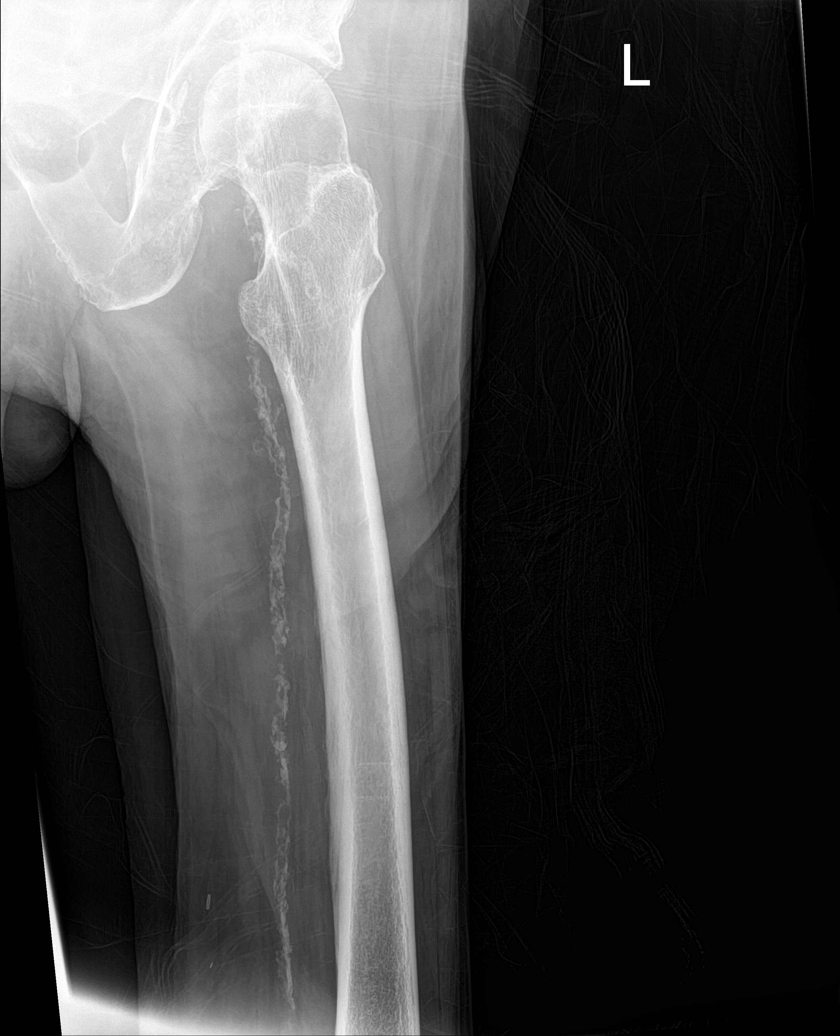

[femur lat (3 of 3)]
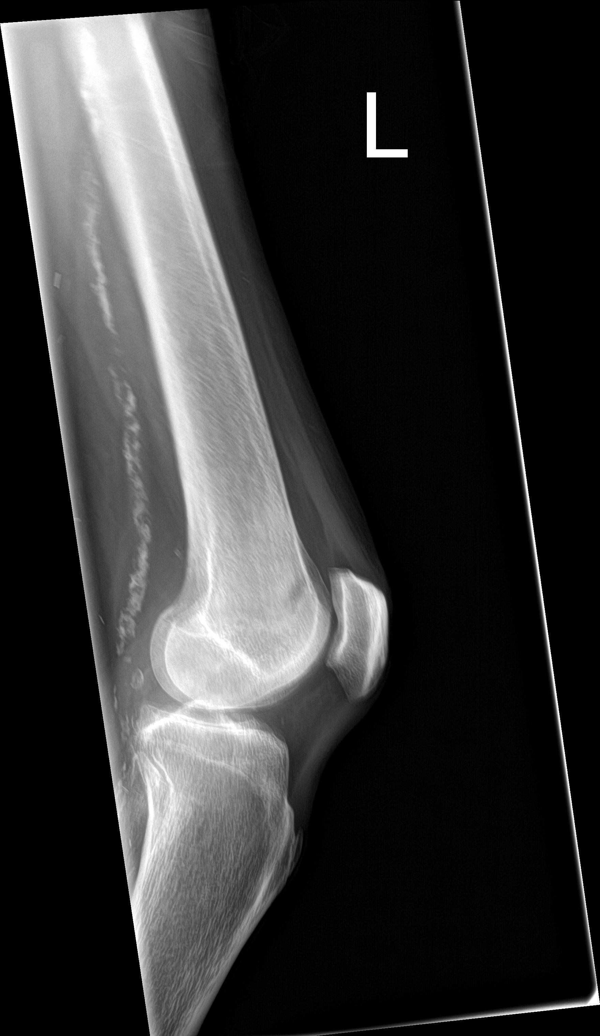

[5 of 5 positions shown; findings below may reference images not displayed]

FINDINGS: No fracture dislocation of LEFT femurs. Extensive arteriovascular
calcification noted.
IMPRESSION: No acute osseous abnormality.
